# Patient Record
Sex: Female | Born: 1960 | Race: White | Hispanic: No | Marital: Married | Smoking: Never smoker
Health system: Southern US, Community
[De-identification: ages and names within clinical notes are randomized; demographics above are authoritative.]

## PROBLEM LIST (undated history)

## (undated) DIAGNOSIS — C801 Malignant (primary) neoplasm, unspecified: Secondary | ICD-10-CM

## (undated) DIAGNOSIS — R011 Cardiac murmur, unspecified: Secondary | ICD-10-CM

## (undated) HISTORY — PX: TONSILLECTOMY: SUR1361

## (undated) HISTORY — PX: TOE SOFT TISSUE RELEASE W/ PINNING: SHX2534

---

## 1999-04-07 ENCOUNTER — Other Ambulatory Visit: Admission: RE | Admit: 1999-04-07 | Discharge: 1999-04-07 | Payer: Self-pay | Admitting: Obstetrics and Gynecology

## 2001-06-05 ENCOUNTER — Other Ambulatory Visit: Admission: RE | Admit: 2001-06-05 | Discharge: 2001-06-05 | Payer: Self-pay | Admitting: Obstetrics and Gynecology

## 2002-08-21 DIAGNOSIS — C801 Malignant (primary) neoplasm, unspecified: Secondary | ICD-10-CM

## 2002-08-21 HISTORY — DX: Malignant (primary) neoplasm, unspecified: C80.1

## 2002-09-25 ENCOUNTER — Other Ambulatory Visit: Admission: RE | Admit: 2002-09-25 | Discharge: 2002-09-25 | Payer: Self-pay | Admitting: Obstetrics and Gynecology

## 2003-05-05 ENCOUNTER — Encounter (INDEPENDENT_AMBULATORY_CARE_PROVIDER_SITE_OTHER): Payer: Self-pay | Admitting: Radiology

## 2003-05-05 ENCOUNTER — Encounter: Payer: Self-pay | Admitting: Obstetrics and Gynecology

## 2003-05-05 ENCOUNTER — Encounter (INDEPENDENT_AMBULATORY_CARE_PROVIDER_SITE_OTHER): Payer: Self-pay | Admitting: *Deleted

## 2003-05-05 ENCOUNTER — Encounter: Admission: RE | Admit: 2003-05-05 | Discharge: 2003-05-05 | Payer: Self-pay | Admitting: Obstetrics and Gynecology

## 2003-05-11 ENCOUNTER — Encounter: Payer: Self-pay | Admitting: Oncology

## 2003-05-11 ENCOUNTER — Encounter (HOSPITAL_COMMUNITY): Admission: RE | Admit: 2003-05-11 | Discharge: 2003-08-09 | Payer: Self-pay | Admitting: *Deleted

## 2003-05-11 ENCOUNTER — Encounter: Admission: RE | Admit: 2003-05-11 | Discharge: 2003-05-11 | Payer: Self-pay | Admitting: Oncology

## 2003-05-12 ENCOUNTER — Encounter: Admission: RE | Admit: 2003-05-12 | Discharge: 2003-05-12 | Payer: Self-pay | Admitting: Oncology

## 2003-05-12 ENCOUNTER — Encounter: Payer: Self-pay | Admitting: Oncology

## 2003-05-13 ENCOUNTER — Encounter: Payer: Self-pay | Admitting: General Surgery

## 2003-05-13 ENCOUNTER — Ambulatory Visit (HOSPITAL_BASED_OUTPATIENT_CLINIC_OR_DEPARTMENT_OTHER): Admission: RE | Admit: 2003-05-13 | Discharge: 2003-05-13 | Payer: Self-pay | Admitting: General Surgery

## 2003-06-05 ENCOUNTER — Encounter: Admission: RE | Admit: 2003-06-05 | Discharge: 2003-06-05 | Payer: Self-pay | Admitting: *Deleted

## 2003-07-17 ENCOUNTER — Ambulatory Visit (HOSPITAL_COMMUNITY): Admission: RE | Admit: 2003-07-17 | Discharge: 2003-07-17 | Payer: Self-pay | Admitting: Family Medicine

## 2003-07-20 ENCOUNTER — Ambulatory Visit (HOSPITAL_COMMUNITY): Admission: RE | Admit: 2003-07-20 | Discharge: 2003-07-20 | Payer: Self-pay | Admitting: Oncology

## 2003-07-23 ENCOUNTER — Ambulatory Visit (HOSPITAL_COMMUNITY): Admission: RE | Admit: 2003-07-23 | Discharge: 2003-07-23 | Payer: Self-pay | Admitting: General Surgery

## 2003-07-24 ENCOUNTER — Ambulatory Visit (HOSPITAL_COMMUNITY): Admission: RE | Admit: 2003-07-24 | Discharge: 2003-07-24 | Payer: Self-pay | Admitting: Oncology

## 2003-08-03 ENCOUNTER — Ambulatory Visit (HOSPITAL_COMMUNITY): Admission: RE | Admit: 2003-08-03 | Discharge: 2003-08-03 | Payer: Self-pay | Admitting: Oncology

## 2003-08-07 ENCOUNTER — Ambulatory Visit (HOSPITAL_COMMUNITY): Admission: RE | Admit: 2003-08-07 | Discharge: 2003-08-07 | Payer: Self-pay | Admitting: Oncology

## 2003-09-20 ENCOUNTER — Emergency Department (HOSPITAL_COMMUNITY): Admission: EM | Admit: 2003-09-20 | Discharge: 2003-09-21 | Payer: Self-pay | Admitting: Emergency Medicine

## 2003-09-22 HISTORY — PX: BREAST SURGERY: SHX581

## 2003-09-26 ENCOUNTER — Ambulatory Visit: Admission: RE | Admit: 2003-09-26 | Discharge: 2003-12-28 | Payer: Self-pay | Admitting: *Deleted

## 2003-09-29 ENCOUNTER — Encounter: Admission: RE | Admit: 2003-09-29 | Discharge: 2003-09-29 | Payer: Self-pay | Admitting: Oncology

## 2003-10-06 ENCOUNTER — Encounter (INDEPENDENT_AMBULATORY_CARE_PROVIDER_SITE_OTHER): Payer: Self-pay | Admitting: *Deleted

## 2003-10-06 ENCOUNTER — Ambulatory Visit (HOSPITAL_COMMUNITY): Admission: AD | Admit: 2003-10-06 | Discharge: 2003-10-08 | Payer: Self-pay | Admitting: *Deleted

## 2004-01-29 ENCOUNTER — Ambulatory Visit: Admission: RE | Admit: 2004-01-29 | Discharge: 2004-01-29 | Payer: Self-pay | Admitting: *Deleted

## 2004-02-09 ENCOUNTER — Encounter (INDEPENDENT_AMBULATORY_CARE_PROVIDER_SITE_OTHER): Payer: Self-pay | Admitting: Cardiology

## 2004-02-09 ENCOUNTER — Ambulatory Visit: Admission: RE | Admit: 2004-02-09 | Discharge: 2004-02-09 | Payer: Self-pay | Admitting: Oncology

## 2004-05-05 ENCOUNTER — Encounter (INDEPENDENT_AMBULATORY_CARE_PROVIDER_SITE_OTHER): Payer: Self-pay | Admitting: *Deleted

## 2004-05-05 ENCOUNTER — Ambulatory Visit: Admission: RE | Admit: 2004-05-05 | Discharge: 2004-05-05 | Payer: Self-pay | Admitting: Oncology

## 2004-06-16 ENCOUNTER — Ambulatory Visit: Payer: Self-pay | Admitting: Oncology

## 2004-06-28 ENCOUNTER — Ambulatory Visit: Admission: RE | Admit: 2004-06-28 | Discharge: 2004-06-28 | Payer: Self-pay | Admitting: Oncology

## 2004-06-28 ENCOUNTER — Encounter (INDEPENDENT_AMBULATORY_CARE_PROVIDER_SITE_OTHER): Payer: Self-pay | Admitting: Cardiology

## 2004-08-23 ENCOUNTER — Ambulatory Visit: Payer: Self-pay | Admitting: Oncology

## 2004-09-06 ENCOUNTER — Encounter (INDEPENDENT_AMBULATORY_CARE_PROVIDER_SITE_OTHER): Payer: Self-pay | Admitting: Cardiology

## 2004-09-06 ENCOUNTER — Ambulatory Visit: Admission: RE | Admit: 2004-09-06 | Discharge: 2004-09-06 | Payer: Self-pay | Admitting: Oncology

## 2004-10-24 ENCOUNTER — Ambulatory Visit: Payer: Self-pay | Admitting: Oncology

## 2004-11-22 ENCOUNTER — Ambulatory Visit (HOSPITAL_COMMUNITY): Admission: RE | Admit: 2004-11-22 | Discharge: 2004-11-22 | Payer: Self-pay | Admitting: Gastroenterology

## 2004-12-20 ENCOUNTER — Ambulatory Visit: Admission: RE | Admit: 2004-12-20 | Discharge: 2004-12-20 | Payer: Self-pay | Admitting: Oncology

## 2004-12-20 ENCOUNTER — Encounter (INDEPENDENT_AMBULATORY_CARE_PROVIDER_SITE_OTHER): Payer: Self-pay | Admitting: Cardiology

## 2004-12-26 ENCOUNTER — Ambulatory Visit: Payer: Self-pay | Admitting: Oncology

## 2005-04-06 ENCOUNTER — Ambulatory Visit: Payer: Self-pay | Admitting: Oncology

## 2005-04-13 ENCOUNTER — Encounter (INDEPENDENT_AMBULATORY_CARE_PROVIDER_SITE_OTHER): Payer: Self-pay | Admitting: *Deleted

## 2005-04-13 ENCOUNTER — Ambulatory Visit: Admission: RE | Admit: 2005-04-13 | Discharge: 2005-04-13 | Payer: Self-pay | Admitting: Oncology

## 2005-10-02 ENCOUNTER — Ambulatory Visit: Payer: Self-pay | Admitting: Oncology

## 2005-10-05 ENCOUNTER — Ambulatory Visit (HOSPITAL_COMMUNITY): Admission: RE | Admit: 2005-10-05 | Discharge: 2005-10-05 | Payer: Self-pay | Admitting: Oncology

## 2006-06-08 ENCOUNTER — Ambulatory Visit: Payer: Self-pay | Admitting: Oncology

## 2006-06-12 LAB — CBC WITH DIFFERENTIAL/PLATELET
Eosinophils Absolute: 0.2 10*3/uL (ref 0.0–0.5)
LYMPH%: 30.2 % (ref 14.0–48.0)
MONO#: 0.5 10*3/uL (ref 0.1–0.9)
NEUT#: 3.5 10*3/uL (ref 1.5–6.5)
Platelets: 280 10*3/uL (ref 145–400)
RBC: 4.32 10*6/uL (ref 3.70–5.32)
WBC: 6 10*3/uL (ref 3.9–10.0)
lymph#: 1.8 10*3/uL (ref 0.9–3.3)

## 2006-06-12 LAB — COMPREHENSIVE METABOLIC PANEL
Albumin: 4.7 g/dL (ref 3.5–5.2)
Alkaline Phosphatase: 67 U/L (ref 39–117)
BUN: 11 mg/dL (ref 6–23)
Creatinine, Ser: 0.76 mg/dL (ref 0.40–1.20)
Glucose, Bld: 86 mg/dL (ref 70–99)
Potassium: 4.8 mEq/L (ref 3.5–5.3)

## 2006-12-06 ENCOUNTER — Ambulatory Visit: Payer: Self-pay | Admitting: Oncology

## 2006-12-11 LAB — CBC WITH DIFFERENTIAL/PLATELET
Eosinophils Absolute: 0.1 10*3/uL (ref 0.0–0.5)
HGB: 13.1 g/dL (ref 11.6–15.9)
LYMPH%: 24 % (ref 14.0–48.0)
MONO#: 0.4 10*3/uL (ref 0.1–0.9)
NEUT#: 4 10*3/uL (ref 1.5–6.5)
Platelets: 239 10*3/uL (ref 145–400)
RBC: 4.36 10*6/uL (ref 3.70–5.32)
RDW: 13.5 % (ref 11.3–14.5)
WBC: 6 10*3/uL (ref 3.9–10.0)

## 2006-12-11 LAB — COMPREHENSIVE METABOLIC PANEL
Albumin: 4.7 g/dL (ref 3.5–5.2)
CO2: 26 mEq/L (ref 19–32)
Calcium: 9.5 mg/dL (ref 8.4–10.5)
Glucose, Bld: 139 mg/dL — ABNORMAL HIGH (ref 70–99)
Potassium: 4.5 mEq/L (ref 3.5–5.3)
Sodium: 140 mEq/L (ref 135–145)
Total Protein: 7.3 g/dL (ref 6.0–8.3)

## 2006-12-11 LAB — CANCER ANTIGEN 27.29: CA 27.29: 13 U/mL (ref 0–39)

## 2007-06-06 ENCOUNTER — Ambulatory Visit: Payer: Self-pay | Admitting: Oncology

## 2007-06-11 LAB — CBC WITH DIFFERENTIAL/PLATELET
BASO%: 0.6 % (ref 0.0–2.0)
EOS%: 2.3 % (ref 0.0–7.0)
LYMPH%: 21.4 % (ref 14.0–48.0)
MCH: 29.6 pg (ref 26.0–34.0)
MCHC: 33.7 g/dL (ref 32.0–36.0)
MCV: 87.7 fL (ref 81.0–101.0)
MONO%: 6.7 % (ref 0.0–13.0)
NEUT%: 69 % (ref 39.6–76.8)
Platelets: 267 10*3/uL (ref 145–400)
RBC: 4.37 10*6/uL (ref 3.70–5.32)
WBC: 6.3 10*3/uL (ref 3.9–10.0)

## 2007-06-11 LAB — COMPREHENSIVE METABOLIC PANEL
ALT: 19 U/L (ref 0–35)
Alkaline Phosphatase: 72 U/L (ref 39–117)
Creatinine, Ser: 0.8 mg/dL (ref 0.40–1.20)
Sodium: 142 mEq/L (ref 135–145)
Total Bilirubin: 0.4 mg/dL (ref 0.3–1.2)
Total Protein: 7.5 g/dL (ref 6.0–8.3)

## 2007-12-05 ENCOUNTER — Ambulatory Visit: Payer: Self-pay | Admitting: Oncology

## 2007-12-12 LAB — CBC WITH DIFFERENTIAL/PLATELET
Basophils Absolute: 0 10*3/uL (ref 0.0–0.1)
EOS%: 2.1 % (ref 0.0–7.0)
HCT: 38.3 % (ref 34.8–46.6)
HGB: 13.6 g/dL (ref 11.6–15.9)
MCH: 30.2 pg (ref 26.0–34.0)
MCV: 85.1 fL (ref 81.0–101.0)
MONO%: 8.1 % (ref 0.0–13.0)
NEUT%: 62.4 % (ref 39.6–76.8)

## 2007-12-13 LAB — COMPREHENSIVE METABOLIC PANEL
AST: 22 U/L (ref 0–37)
Alkaline Phosphatase: 69 U/L (ref 39–117)
BUN: 13 mg/dL (ref 6–23)
Calcium: 9.6 mg/dL (ref 8.4–10.5)
Chloride: 104 mEq/L (ref 96–112)
Creatinine, Ser: 0.71 mg/dL (ref 0.40–1.20)

## 2008-06-08 ENCOUNTER — Ambulatory Visit: Payer: Self-pay | Admitting: Oncology

## 2008-06-10 LAB — CBC WITH DIFFERENTIAL/PLATELET
BASO%: 1.1 % (ref 0.0–2.0)
LYMPH%: 24.1 % (ref 14.0–48.0)
MCHC: 34.9 g/dL (ref 32.0–36.0)
MONO#: 0.4 10*3/uL (ref 0.1–0.9)
MONO%: 7.2 % (ref 0.0–13.0)
Platelets: 225 10*3/uL (ref 145–400)
RBC: 4.35 10*6/uL (ref 3.70–5.32)
RDW: 12.6 % (ref 11.3–14.5)
WBC: 5.2 10*3/uL (ref 3.9–10.0)

## 2008-06-11 LAB — COMPREHENSIVE METABOLIC PANEL
ALT: 21 U/L (ref 0–35)
Alkaline Phosphatase: 66 U/L (ref 39–117)
CO2: 24 mEq/L (ref 19–32)
Sodium: 140 mEq/L (ref 135–145)
Total Bilirubin: 0.5 mg/dL (ref 0.3–1.2)
Total Protein: 7 g/dL (ref 6.0–8.3)

## 2008-12-07 ENCOUNTER — Ambulatory Visit: Payer: Self-pay | Admitting: Oncology

## 2008-12-09 LAB — CBC WITH DIFFERENTIAL/PLATELET
Basophils Absolute: 0 10*3/uL (ref 0.0–0.1)
EOS%: 2.3 % (ref 0.0–7.0)
Eosinophils Absolute: 0.2 10*3/uL (ref 0.0–0.5)
HCT: 39.1 % (ref 34.8–46.6)
HGB: 13.4 g/dL (ref 11.6–15.9)
MONO#: 0.6 10*3/uL (ref 0.1–0.9)
NEUT#: 3.8 10*3/uL (ref 1.5–6.5)
RDW: 13.4 % (ref 11.2–14.5)
WBC: 6.8 10*3/uL (ref 3.9–10.3)
lymph#: 2.3 10*3/uL (ref 0.9–3.3)

## 2008-12-09 LAB — COMPREHENSIVE METABOLIC PANEL
AST: 22 U/L (ref 0–37)
Albumin: 4.4 g/dL (ref 3.5–5.2)
BUN: 13 mg/dL (ref 6–23)
CO2: 27 mEq/L (ref 19–32)
Calcium: 9.5 mg/dL (ref 8.4–10.5)
Chloride: 101 mEq/L (ref 96–112)
Glucose, Bld: 81 mg/dL (ref 70–99)
Potassium: 3.9 mEq/L (ref 3.5–5.3)

## 2008-12-10 LAB — CANCER ANTIGEN 27.29: CA 27.29: 19 U/mL (ref 0–39)

## 2009-05-17 ENCOUNTER — Emergency Department (HOSPITAL_COMMUNITY): Admission: EM | Admit: 2009-05-17 | Discharge: 2009-05-18 | Payer: Self-pay | Admitting: Emergency Medicine

## 2009-06-04 ENCOUNTER — Ambulatory Visit: Payer: Self-pay | Admitting: Oncology

## 2009-06-08 LAB — CBC WITH DIFFERENTIAL/PLATELET
BASO%: 0.3 % (ref 0.0–2.0)
Eosinophils Absolute: 0.2 10*3/uL (ref 0.0–0.5)
HCT: 37.4 % (ref 34.8–46.6)
LYMPH%: 28.1 % (ref 14.0–49.7)
MCHC: 35.5 g/dL (ref 31.5–36.0)
MCV: 88.9 fL (ref 79.5–101.0)
MONO#: 0.4 10*3/uL (ref 0.1–0.9)
MONO%: 6.4 % (ref 0.0–14.0)
NEUT%: 62.4 % (ref 38.4–76.8)
Platelets: 198 10*3/uL (ref 145–400)
RBC: 4.2 10*6/uL (ref 3.70–5.45)
WBC: 5.8 10*3/uL (ref 3.9–10.3)

## 2009-06-08 LAB — COMPREHENSIVE METABOLIC PANEL
Alkaline Phosphatase: 46 U/L (ref 39–117)
CO2: 28 mEq/L (ref 19–32)
Creatinine, Ser: 0.67 mg/dL (ref 0.40–1.20)
Glucose, Bld: 104 mg/dL — ABNORMAL HIGH (ref 70–99)
Total Bilirubin: 0.7 mg/dL (ref 0.3–1.2)

## 2009-06-09 LAB — CANCER ANTIGEN 27.29: CA 27.29: 18 U/mL (ref 0–39)

## 2009-06-09 LAB — VITAMIN D 25 HYDROXY (VIT D DEFICIENCY, FRACTURES): Vit D, 25-Hydroxy: 25 ng/mL — ABNORMAL LOW (ref 30–89)

## 2010-01-19 DIAGNOSIS — H538 Other visual disturbances: Secondary | ICD-10-CM

## 2010-01-19 DIAGNOSIS — R002 Palpitations: Secondary | ICD-10-CM

## 2010-01-20 ENCOUNTER — Ambulatory Visit: Payer: Self-pay | Admitting: Cardiovascular Disease

## 2010-01-20 DIAGNOSIS — R9431 Abnormal electrocardiogram [ECG] [EKG]: Secondary | ICD-10-CM

## 2010-06-03 ENCOUNTER — Ambulatory Visit: Payer: Self-pay | Admitting: Oncology

## 2010-06-07 LAB — CBC WITH DIFFERENTIAL/PLATELET
BASO%: 0.2 % (ref 0.0–2.0)
Basophils Absolute: 0 10*3/uL (ref 0.0–0.1)
EOS%: 2 % (ref 0.0–7.0)
Eosinophils Absolute: 0.1 10*3/uL (ref 0.0–0.5)
HCT: 36.3 % (ref 34.8–46.6)
HGB: 12.6 g/dL (ref 11.6–15.9)
LYMPH%: 24.8 % (ref 14.0–49.7)
MCH: 31.5 pg (ref 25.1–34.0)
MCHC: 34.9 g/dL (ref 31.5–36.0)
MCV: 90.3 fL (ref 79.5–101.0)
MONO#: 0.4 10*3/uL (ref 0.1–0.9)
MONO%: 7.7 % (ref 0.0–14.0)
NEUT#: 3.8 10*3/uL (ref 1.5–6.5)
NEUT%: 65.3 % (ref 38.4–76.8)
Platelets: 205 10*3/uL (ref 145–400)
RBC: 4.01 10*6/uL (ref 3.70–5.45)
RDW: 12.6 % (ref 11.2–14.5)
WBC: 5.8 10*3/uL (ref 3.9–10.3)
lymph#: 1.4 10*3/uL (ref 0.9–3.3)

## 2010-06-07 LAB — COMPREHENSIVE METABOLIC PANEL
ALT: 23 U/L (ref 0–35)
AST: 26 U/L (ref 0–37)
Albumin: 4 g/dL (ref 3.5–5.2)
Alkaline Phosphatase: 52 U/L (ref 39–117)
BUN: 14 mg/dL (ref 6–23)
CO2: 30 mEq/L (ref 19–32)
Calcium: 9.2 mg/dL (ref 8.4–10.5)
Chloride: 107 mEq/L (ref 96–112)
Creatinine, Ser: 0.79 mg/dL (ref 0.40–1.20)
Glucose, Bld: 110 mg/dL — ABNORMAL HIGH (ref 70–99)
Potassium: 3.8 mEq/L (ref 3.5–5.3)
Sodium: 141 mEq/L (ref 135–145)
Total Bilirubin: 0.6 mg/dL (ref 0.3–1.2)
Total Protein: 6.9 g/dL (ref 6.0–8.3)

## 2010-06-08 LAB — VITAMIN D 25 HYDROXY (VIT D DEFICIENCY, FRACTURES): Vit D, 25-Hydroxy: 47 ng/mL (ref 30–89)

## 2010-06-08 LAB — CANCER ANTIGEN 27.29: CA 27.29: 10 U/mL (ref 0–39)

## 2010-09-10 ENCOUNTER — Encounter: Payer: Self-pay | Admitting: Oncology

## 2010-09-20 NOTE — Assessment & Plan Note (Signed)
Summary: np6/palps/jml   Visit Type:  Follow-up Primary Provider:  Rochell Muse, PA-C  CC:  No cardiac complaints.  History of Present Illness: 50 yo WF with history of breast cancer but no history of DM, HTN or hyperlipidemia who is referred today for evaluation of "skipped heartbeat". She was at work several weeks ago and had floaters over her eyes, eyes were watering. She did not feel dizzy. No chest pain or SOB. The symptoms lasted for several minutes. She went to primary care for evaluation because a coworker felt her pulse and thought it was irregular. She was not aware of any irregularity of her heart rhythm. She has never noticed palpitations. She feels in her normal state of health. She is due to see an ophthalmologist tomorrow.   Current Medications (verified): 1)  Venlafaxine Hcl 37.5 Mg Tabs (Venlafaxine Hcl) .... Take One Tablet By Mouth Once Daily. 2)  Gabapentin 300 Mg Caps (Gabapentin) .Marland Kitchen.. 1 Capsule Daily 3)  Alendronate Sodium 70 Mg Tabs (Alendronate Sodium) .... Weekly 4)  Tamoxifen Citrate 20 Mg Tabs (Tamoxifen Citrate) .... Take One Tablet By Mouth Once Daily. 5)  Vitamin D .... Once Daily 6)  Calcium Carbonate   Powd (Calcium Carbonate) .... Once Daily  Allergies (verified): No Known Drug Allergies  Past History:  Past Medical History: Breast cancer 6 years ago- bilateral mastectomy, chemo, radiation  Past Surgical History: Bilateral mastectomy Tonsillectomy as child SH/Risk Factors reviewed for relevance  Family History: Mother and father alive.  Father alive has stage IV lung cancer Mother alive and healthy No siblings Paternal grandfather CAD  Social History: No tobacco Social alcohol No illicit drugs Married, 2 children (20,22) Curriculum specialist with [employer]  Review of Systems  The patient denies fatigue, malaise, fever, weight gain/loss, vision loss, decreased hearing, hoarseness, chest pain, palpitations, shortness of  breath, prolonged cough, wheezing, sleep apnea, coughing up blood, abdominal pain, blood in stool, nausea, vomiting, diarrhea, heartburn, incontinence, blood in urine, muscle weakness, joint pain, leg swelling, rash, skin lesions, headache, fainting, dizziness, depression, anxiety, enlarged lymph nodes, easy bruising or bleeding, and environmental allergies.         Visual changes, see HPI  Vital Signs:  Patient profile:   50 year old female Height:      62 inches Weight:      182 pounds BMI:     33.41 Pulse rate:   100 / minute BP sitting:   140 / 84  (left arm)  Vitals Entered By: Laurance Flatten CMA (January 20, 2010 10:58 AM)  Physical Exam  General:  General: Well developed, well nourished, NAD HEENT: OP clear, mucus membranes moist SKIN: warm, dry Neuro: No focal deficits Musculoskeletal: Muscle strength 5/5 all ext Psychiatric: Mood and affect normal Neck: No JVD, no carotid bruits, no thyromegaly, no lymphadenopathy. Lungs:Clear bilaterally, no wheezes, rhonci, crackles CV: RRR no murmurs, gallops rubs Abdomen: soft, NT, ND, BS present Extremities: No edema, pulses 2+.    EKG  Procedure date:  01/20/2010  Findings:      NSR, rate 100 bpm. LAD.   Impression & Recommendations:  Problem # 1:  ABNORMAL EKG (ICD-794.31) She has left axis deviation on EKG with NSR. She has had no palpitations, irregularity of her heart rhythm, dizziness, near syncope or syncope. I do not see need for further cardiac workup at this time. She will alert [place] of any changes in her clinical status.   Other Orders: EKG w/ Interpretation ([id number])  Patient Instructions:  1)  Your physician recommends that you schedule a follow-up appointment in: as needed.

## 2010-11-25 LAB — URINALYSIS, ROUTINE W REFLEX MICROSCOPIC
Bilirubin Urine: NEGATIVE
Nitrite: NEGATIVE
Specific Gravity, Urine: 1.009 (ref 1.005–1.030)
Urobilinogen, UA: 0.2 mg/dL (ref 0.0–1.0)
pH: 7.5 (ref 5.0–8.0)

## 2010-11-25 LAB — CBC
HCT: 42.5 % (ref 36.0–46.0)
Hemoglobin: 14.4 g/dL (ref 12.0–15.0)
MCHC: 33.8 g/dL (ref 30.0–36.0)
MCV: 90.6 fL (ref 78.0–100.0)
Platelets: 224 10*3/uL (ref 150–400)
RBC: 4.69 MIL/uL (ref 3.87–5.11)
RDW: 13.8 % (ref 11.5–15.5)
WBC: 15.8 10*3/uL — ABNORMAL HIGH (ref 4.0–10.5)

## 2010-11-25 LAB — URINE MICROSCOPIC-ADD ON

## 2010-11-25 LAB — COMPREHENSIVE METABOLIC PANEL
ALT: 17 U/L (ref 0–35)
AST: 22 U/L (ref 0–37)
Albumin: 4.2 g/dL (ref 3.5–5.2)
Alkaline Phosphatase: 58 U/L (ref 39–117)
BUN: 10 mg/dL (ref 6–23)
CO2: 28 mEq/L (ref 19–32)
Calcium: 9.1 mg/dL (ref 8.4–10.5)
Chloride: 102 mEq/L (ref 96–112)
Creatinine, Ser: 0.74 mg/dL (ref 0.4–1.2)
GFR calc Af Amer: >60 mL/min (ref >60)
GFR calc non Af Amer: >60 mL/min (ref >60)
Glucose, Bld: 99 mg/dL (ref 70–99)
Potassium: 3.3 mEq/L — ABNORMAL LOW (ref 3.5–5.1)
Sodium: 138 mEq/L (ref 135–145)
Total Bilirubin: 1 mg/dL (ref 0.3–1.2)
Total Protein: 7.6 g/dL (ref 6.0–8.3)

## 2010-11-25 LAB — DIFFERENTIAL
Basophils Absolute: 0 10*3/uL (ref 0.0–0.1)
Basophils Relative: 0 % (ref 0–1)
Eosinophils Absolute: 0.1 10*3/uL (ref 0.0–0.7)
Eosinophils Relative: 1 % (ref 0–5)
Lymphocytes Relative: 13 % (ref 12–46)
Lymphs Abs: 2 10*3/uL (ref 0.7–4.0)
Monocytes Absolute: 0.9 10*3/uL (ref 0.1–1.0)
Monocytes Relative: 6 % (ref 3–12)
Neutro Abs: 12.7 10*3/uL — ABNORMAL HIGH (ref 1.7–7.7)
Neutrophils Relative %: 81 % — ABNORMAL HIGH (ref 43–77)

## 2010-11-25 LAB — LIPASE, BLOOD: Lipase: 40 U/L (ref 11–59)

## 2011-01-06 NOTE — Op Note (Signed)
Tammy Huynh, BREVIK NO.:  [id number]   MEDICAL RECORD NO.:  [id number]          PATIENT TYPE:  AMB   LOCATION:  ENDO                         FACILITY:  [facility]   PHYSICIAN:  Graylin Shiver, M.D.   DATE OF BIRTH:  [birth year]   DATE OF PROCEDURE:  11/22/2004  DATE OF DISCHARGE:                                 OPERATIVE REPORT   PROCEDURE:  Colonoscopy.   INDICATIONS:  Rectal bleeding.   Informed consent was obtained after explanation of the risks of bleeding,  infection and perforation.   PREMEDICATION:  Fentanyl 80 mcg IV, Versed 9 mg IV.   PROCEDURE:  With the patient in the left lateral decubitus position, a  rectal exam was performed.  No masses were felt in the rectal vault.  There  were protruding hemorrhoids.  The Olympus colonoscope was inserted into the  rectum and advanced around the colon to the cecum.  Cecal landmarks were  identified.  The cecum and descending colon were normal, the transverse  colon normal.  The descending colon was normal.  The sigmoid showed a few diverticula.  The rectum was normal.  She tolerated  the procedure well without complications.   IMPRESSION:  1.  Hemorrhoids.  2.  Few diverticula in the sigmoid.   I would recommend a follow-up screening colonoscopy again in 10 years.      SFG/MEDQ  D:  11/22/2004  T:  11/22/2004  Job:  [id number]   cc:   Valentino Hue. Magrinat, M.D.  [address] [facility]  [place]  [place] [address]  Fax: [phone]

## 2011-06-12 ENCOUNTER — Encounter (HOSPITAL_BASED_OUTPATIENT_CLINIC_OR_DEPARTMENT_OTHER): Payer: BC Managed Care – PPO | Admitting: Oncology

## 2011-06-12 ENCOUNTER — Other Ambulatory Visit: Payer: Self-pay | Admitting: Oncology

## 2011-06-12 DIAGNOSIS — C50419 Malignant neoplasm of upper-outer quadrant of unspecified female breast: Secondary | ICD-10-CM

## 2011-06-12 DIAGNOSIS — Z170 Estrogen receptor positive status [ER+]: Secondary | ICD-10-CM

## 2011-06-12 LAB — CBC WITH DIFFERENTIAL/PLATELET
Basophils Absolute: 0 10*3/uL (ref 0.0–0.1)
Eosinophils Absolute: 0.1 10*3/uL (ref 0.0–0.5)
HGB: 13.4 g/dL (ref 11.6–15.9)
LYMPH%: 30 % (ref 14.0–49.7)
MCV: 91.4 fL (ref 79.5–101.0)
MONO#: 0.4 10*3/uL (ref 0.1–0.9)
MONO%: 5.4 % (ref 0.0–14.0)
NEUT#: 4.1 10*3/uL (ref 1.5–6.5)
Platelets: 216 10*3/uL (ref 145–400)
RBC: 4.26 10*6/uL (ref 3.70–5.45)
RDW: 13.3 % (ref 11.2–14.5)
WBC: 6.6 10*3/uL (ref 3.9–10.3)

## 2011-06-13 LAB — COMPREHENSIVE METABOLIC PANEL
ALT: 27 U/L (ref 0–35)
Alkaline Phosphatase: 46 U/L (ref 39–117)
CO2: 27 mEq/L (ref 19–32)
Creatinine, Ser: 0.77 mg/dL (ref 0.50–1.10)
Sodium: 139 mEq/L (ref 135–145)
Total Bilirubin: 0.4 mg/dL (ref 0.3–1.2)
Total Protein: 7 g/dL (ref 6.0–8.3)

## 2011-06-13 LAB — CANCER ANTIGEN 27.29: CA 27.29: 8 U/mL (ref 0–39)

## 2011-06-15 ENCOUNTER — Encounter (HOSPITAL_BASED_OUTPATIENT_CLINIC_OR_DEPARTMENT_OTHER): Payer: BC Managed Care – PPO | Admitting: Oncology

## 2011-06-15 DIAGNOSIS — C50919 Malignant neoplasm of unspecified site of unspecified female breast: Secondary | ICD-10-CM

## 2011-06-15 DIAGNOSIS — Z79810 Long term (current) use of selective estrogen receptor modulators (SERMs): Secondary | ICD-10-CM

## 2011-06-15 DIAGNOSIS — Z9010 Acquired absence of unspecified breast and nipple: Secondary | ICD-10-CM

## 2011-07-14 ENCOUNTER — Other Ambulatory Visit: Payer: Self-pay | Admitting: *Deleted

## 2011-07-14 DIAGNOSIS — C50419 Malignant neoplasm of upper-outer quadrant of unspecified female breast: Secondary | ICD-10-CM

## 2011-07-14 MED ORDER — VENLAFAXINE HCL 75 MG PO TABS: 75.0000 mg | ORAL_TABLET | Freq: Every day | ORAL | Status: AC

## 2011-07-24 IMAGING — CT CT ABDOMEN W/ CM
2 of 5 series · 17 of 46 positions shown, 19 images · IV contrast (agent unspecified)
Comparison: None available

CT ABDOMEN

CLINICAL DATA: Abdominal pain.  Right lower quadrant pain for 1
day.  White cell count 15.8.  RBCs white blood cells in urine.
Negative urine pregnancy test.  History breast cancer 6 years ago.

CT ABDOMEN AND PELVIS WITH CONTRAST
TECHNIQUE: Multidetector CT imaging of the abdomen and pelvis was
performed using the standard protocol following bolus
administration of intravenous contrast.
Contrast: 100 ml [id number]

[Series 2: rtn ap with st · axial · 0.66mm/px · z∈[+826,+1226]mm · 14 of 90 slices shown, 16 images]
[im 5/90  soft-tissue]
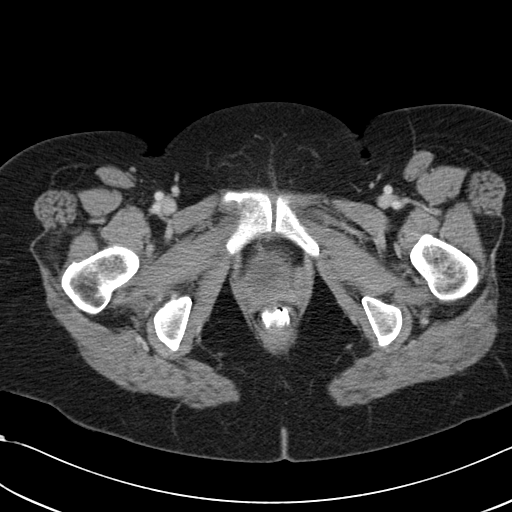
[im 5/90  bone]
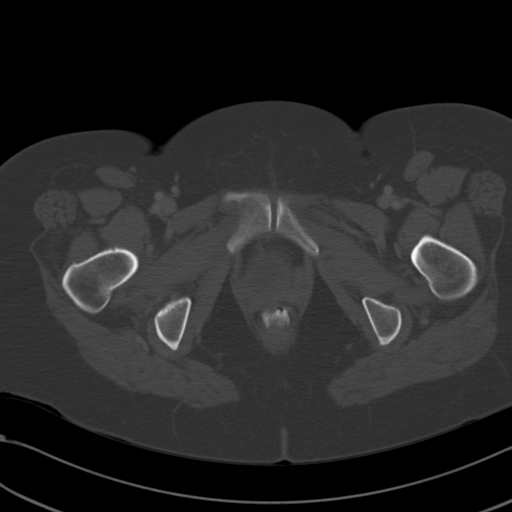
[im 14/90  soft-tissue]
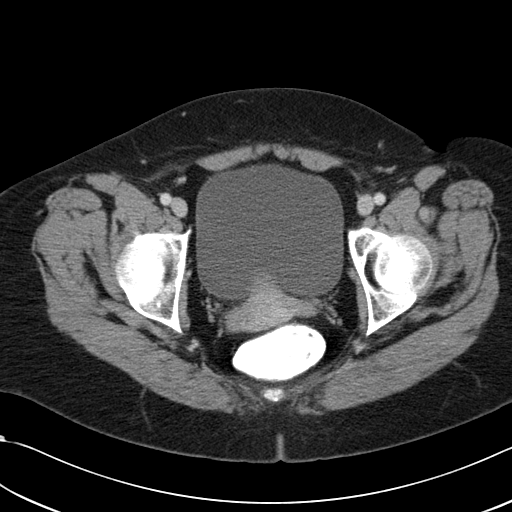
[im 18/90  soft-tissue]
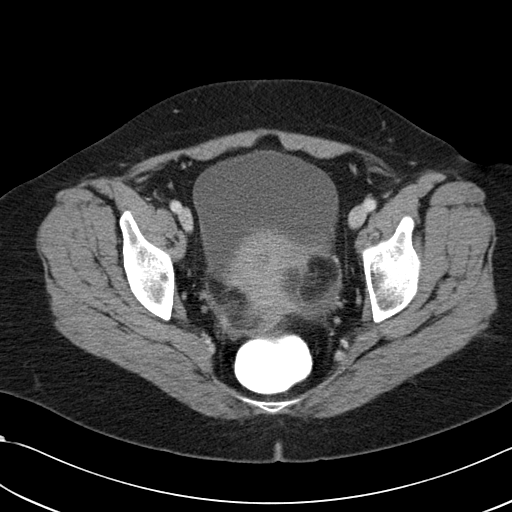
[im 23/90  soft-tissue]
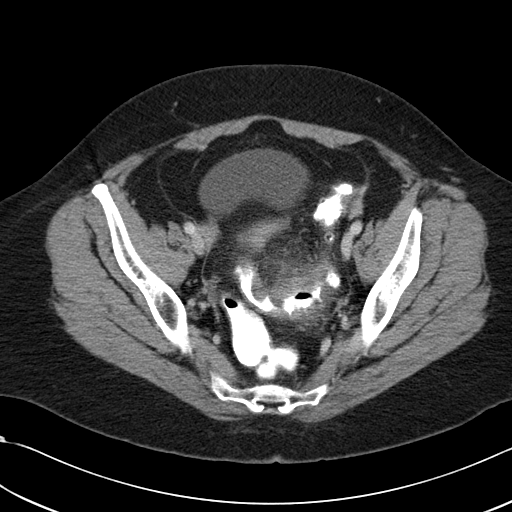
[im 32/90  soft-tissue]
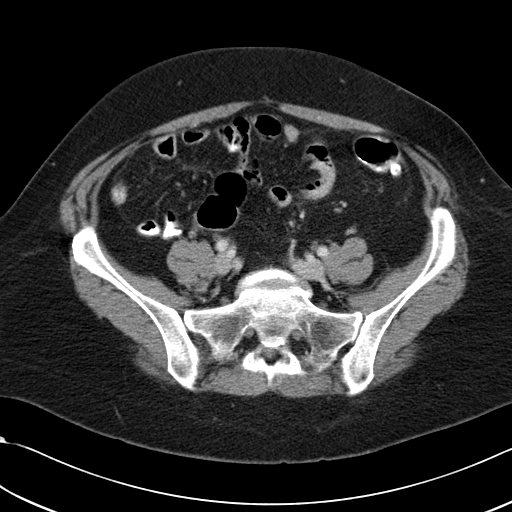
[im 36/90  soft-tissue]
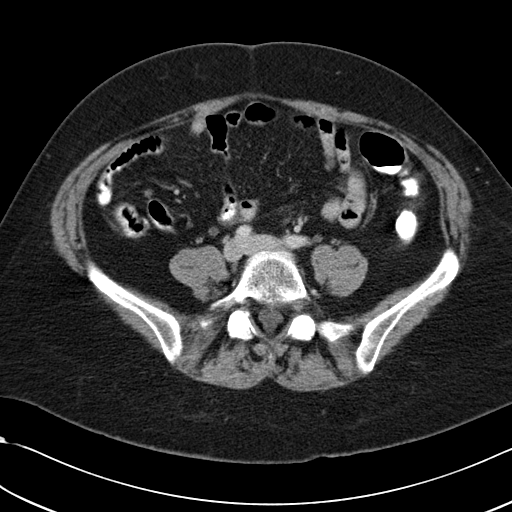
[im 41/90  soft-tissue]
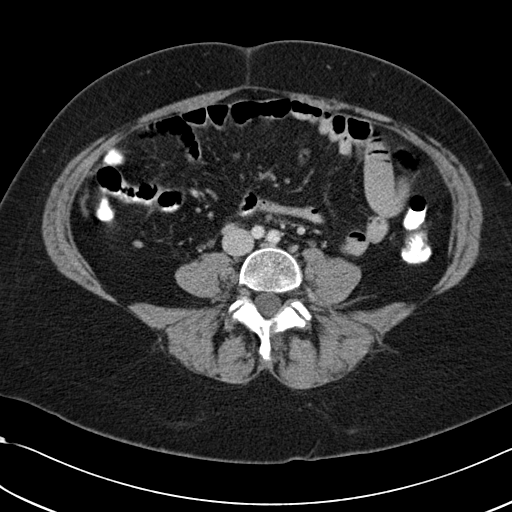
[im 49/90  soft-tissue]
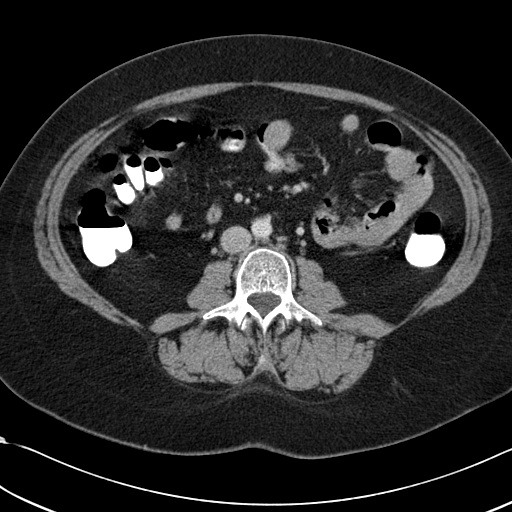
[im 54/90  soft-tissue]
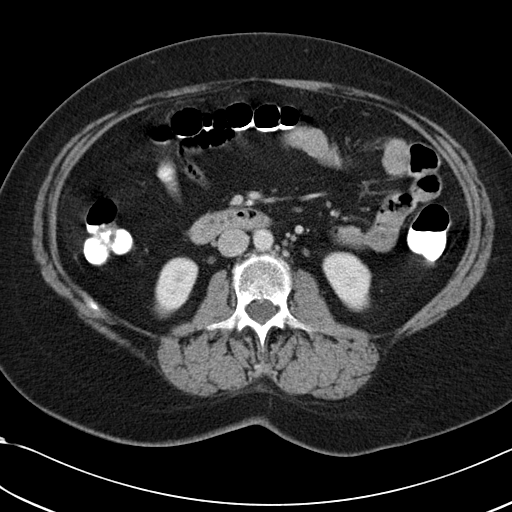
[im 54/90  bone]
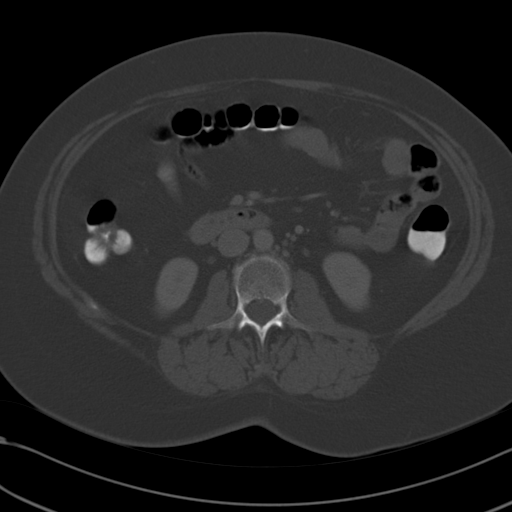
[im 58/90  soft-tissue]
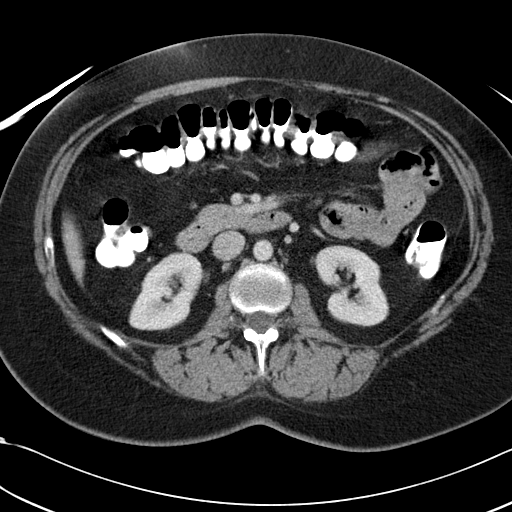
[im 67/90  soft-tissue]
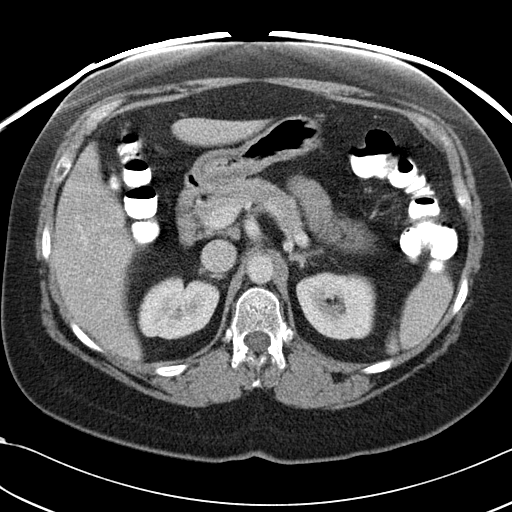
[im 72/90  soft-tissue]
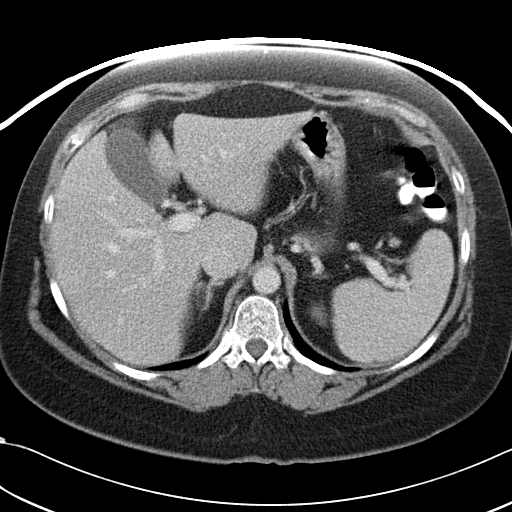
[im 76/90  soft-tissue]
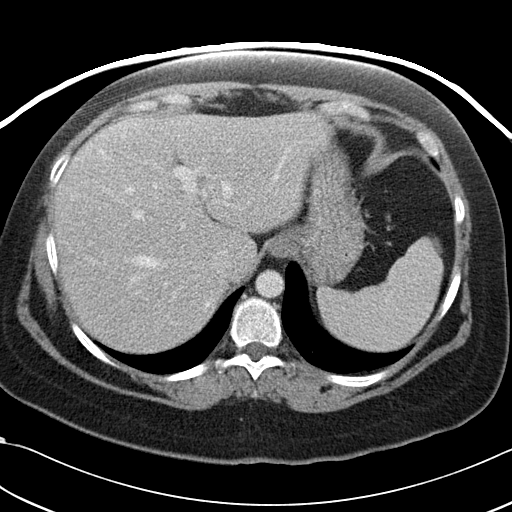
[im 85/90  soft-tissue]
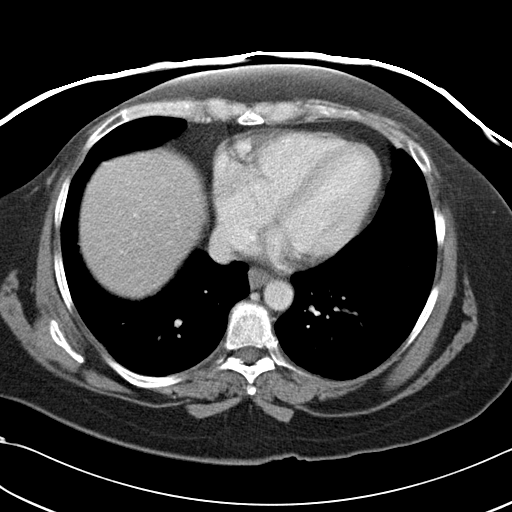

[Series 602: <mpr thick range> · coronal · 0.91mm/px · 3 of 77 slices shown]
[im 26/77  soft-tissue]
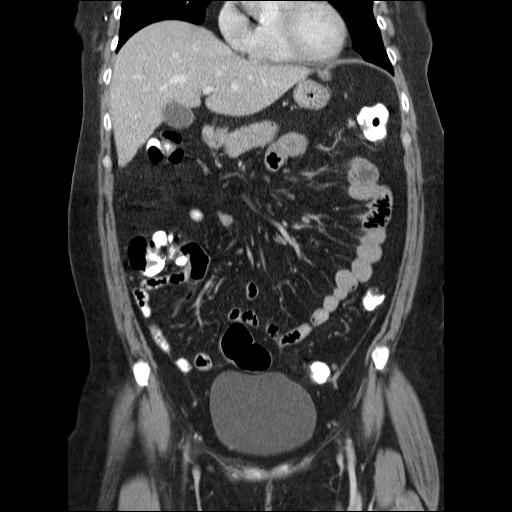
[im 34/77  soft-tissue]
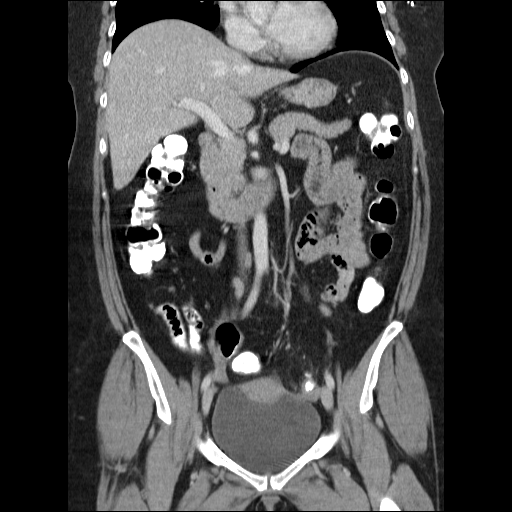
[im 43/77  soft-tissue]
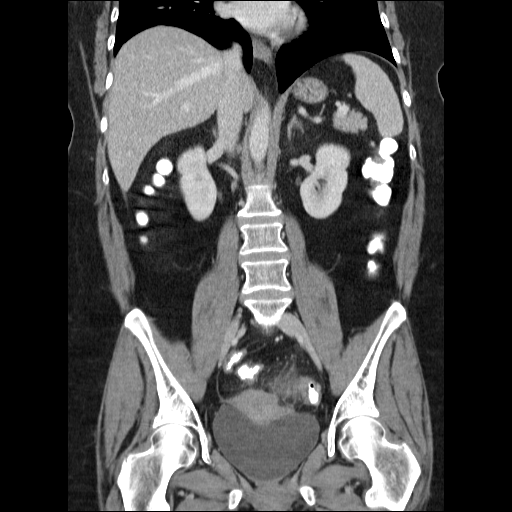

[17 of 46 positions shown; findings below may reference images not displayed]

FINDINGS: Images of the lung bases are unremarkable.  No focal
abnormalities identified within the liver, spleen, pancreas,
adrenal glands, or kidneys.  The gallbladder is present.  There is
no retroperitoneal adenopathy.  The appendix is not well seen.
IMPRESSION: No evidence for acute abdominal abnormality.

CT PELVIS
FINDINGS: Sigmoid colon is markedly thickened and irregular.
Sigmoid diverticula are identified.  The findings are most
consistent with acute diverticulitis.  However, given the marked
wall thickening (2 cm), follow-up barium enema or colonoscopy would
be recommended to exclude malignancy. There is no evidence for
abscess or free intraperitoneal air.

The uterus is present.  No adnexal mass identified.  No pelvic
adenopathy.
IMPRESSION: 1.  Marked irregular thickening of the sigmoid colon, most
consistent with sigmoid diverticulitis.  However, following
clinical improvement, barium enema or colonoscopy would be
suggested to exclude malignancy within this segment of the colon.
2.  No evidence for abscess or free intraperitoneal air.

## 2011-08-24 ENCOUNTER — Other Ambulatory Visit: Payer: Self-pay | Admitting: *Deleted

## 2011-08-24 DIAGNOSIS — C50419 Malignant neoplasm of upper-outer quadrant of unspecified female breast: Secondary | ICD-10-CM

## 2011-08-24 MED ORDER — ALENDRONATE SODIUM 70 MG PO TABS: 70.0000 mg | ORAL_TABLET | ORAL | Status: AC

## 2011-11-17 ENCOUNTER — Other Ambulatory Visit: Payer: Self-pay

## 2011-11-17 DIAGNOSIS — C50919 Malignant neoplasm of unspecified site of unspecified female breast: Secondary | ICD-10-CM

## 2011-11-17 MED ORDER — TAMOXIFEN CITRATE 20 MG PO TABS: 20.0000 mg | ORAL_TABLET | Freq: Every day | ORAL | Status: AC

## 2012-06-13 ENCOUNTER — Other Ambulatory Visit (HOSPITAL_BASED_OUTPATIENT_CLINIC_OR_DEPARTMENT_OTHER): Payer: BC Managed Care – PPO | Admitting: Lab

## 2012-06-13 ENCOUNTER — Telehealth: Payer: Self-pay | Admitting: Oncology

## 2012-06-13 ENCOUNTER — Ambulatory Visit (HOSPITAL_BASED_OUTPATIENT_CLINIC_OR_DEPARTMENT_OTHER): Payer: BC Managed Care – PPO | Admitting: Oncology

## 2012-06-13 ENCOUNTER — Ambulatory Visit: Payer: BC Managed Care – PPO | Admitting: Lab

## 2012-06-13 VITALS — BP 131/83 | HR 101 | Temp 98.7°F | Resp 20 | Ht 62.0 in | Wt 184.0 lb

## 2012-06-13 DIAGNOSIS — C50919 Malignant neoplasm of unspecified site of unspecified female breast: Secondary | ICD-10-CM

## 2012-06-13 DIAGNOSIS — C50419 Malignant neoplasm of upper-outer quadrant of unspecified female breast: Secondary | ICD-10-CM

## 2012-06-13 DIAGNOSIS — Z170 Estrogen receptor positive status [ER+]: Secondary | ICD-10-CM

## 2012-06-13 LAB — CBC WITH DIFFERENTIAL/PLATELET
BASO%: 0.5 % (ref 0.0–2.0)
HCT: 41.1 % (ref 34.8–46.6)
HGB: 14.2 g/dL (ref 11.6–15.9)
MCHC: 34.4 g/dL (ref 31.5–36.0)
MONO#: 0.5 10*3/uL (ref 0.1–0.9)
NEUT%: 60 % (ref 38.4–76.8)
WBC: 7 10*3/uL (ref 3.9–10.3)
lymph#: 2.1 10*3/uL (ref 0.9–3.3)

## 2012-06-13 LAB — COMPREHENSIVE METABOLIC PANEL (CC13)
ALT: 20 U/L (ref 0–55)
Albumin: 4.2 g/dL (ref 3.5–5.0)
CO2: 24 mEq/L (ref 22–29)
Calcium: 9.2 mg/dL (ref 8.4–10.4)
Chloride: 107 mEq/L (ref 98–107)
Creatinine: 0.6 mg/dL (ref 0.6–1.1)
Total Protein: 7.1 g/dL (ref 6.4–8.3)

## 2012-06-13 MED ORDER — GABAPENTIN 300 MG PO CAPS: 300.0000 mg | ORAL_CAPSULE | Freq: Every day | ORAL | Status: DC

## 2012-06-13 MED ORDER — TAMOXIFEN CITRATE 20 MG PO TABS: 20.0000 mg | ORAL_TABLET | Freq: Every day | ORAL | Status: DC

## 2012-06-13 NOTE — Progress Notes (Signed)
ID: [name]   DOB: 07-02-1961  MR#: [id number]  NWG#:[id number]  PCP: No primary provider on file. GYN: Richarda Overlie MD SU:  OTHER MD:   HISTORY OF PRESENT ILLNESS: Mrs. Laiche was taking a shower on 9/13 when she noted a lump in her right breast. She brought this to Dr. Dennie Bible attention the next day, and the day after that, he set her up for ultrasound and mammogram of the right breast at [facility].  This showed a new mass (not appreciated last February when she had her most recent mammogram) in the upper outer aspect of the right breast, measuring approximately 3.5 cm by ultrasound, with a second smaller mass measuring approximately 7 mm.  The axilla was clinically negative.  Core biopsy was performed and the results ([id number]) show a high-grade infiltrating ductal carcinoma. Her subsequent history is as detailed below.   INTERVAL HISTORY: Rasheda returns today for followup of her breast cancer. Interval history is unremarkable. She is really enjoying her work at [employer], which she does not find stressful.  REVIEW OF SYSTEMS: She is tolerating the tamoxifen without significant side effects. She still does have nighttime hot flashes, which wake her up 2-3 times a night. A detailed review of systems today was otherwise entirely negative.  PAST MEDICAL HISTORY: No past medical history on file. Significant for fibromyalgia which has been inactive, a heart murmur which was noted 8/16 and which is not apparent now, and has never required antibiotic prophylaxis, and a history of a mild anxiety disorder, status post tonsillectomy and adenoidectomy.  PAST SURGICAL HISTORY: No past surgical history on file.  FAMILY HISTORY (updated OCT 2013) No family history on file. The patient's father has a history of diabetes. The patient's mother is in good health.  The patient's maternal grandmother did have breast cancer diagnosed in her forties.  Reniya is an only child.     GYNECOLOGIC HISTORY: She is G2 P2.    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  She has a PhD in Interior and spatial designer.  Her husband, Onalee Hua, is Geophysical data processor for the [employer]. They have two children:  Waylan Rocher, who graduated from [employer] in health administration and currently works for [employer],  and [place], who graduated from [employer] and is now Therapist, art at [employer].  The patient is a International aid/development worker.      ADVANCED DIRECTIVES: in place  HEALTH MAINTENANCE: History  Substance Use Topics  . Smoking status: Not on file  . Smokeless tobacco: Not on file  . Alcohol Use: Not on file     Colonoscopy:  PAP:  Bone density:  Lipid panel:  Allergies not on file  Current Outpatient Prescriptions  Medication Sig Dispense Refill  . alendronate (FOSAMAX) 70 MG tablet Take 1 tablet (70 mg total) by mouth every 7 (seven) days. Take with a full glass of water on an empty stomach.  12 tablet  3    OBJECTIVE: Middle-aged white woman who appears well Filed Vitals:   06/13/12 0922  BP: 131/83  Pulse: 101  Temp: 98.7 F (37.1 C)  Resp: 20     Body mass index is 33.65 kg/(m^2).    ECOG FS: 0  Sclerae unicteric Oropharynx clear No cervical or supraclavicular adenopathy Lungs no rales or rhonchi Heart regular rate and rhythm Abd benign MSK no focal  spinal tenderness, no peripheral edema Neuro: nonfocal Breasts: Status post bilateral mastectomies. There is no evidence of disease recurrence   LAB RESULTS: Lab Results  Component Value Date   WBC 6.6 06/12/2011   NEUTROABS 4.1 06/12/2011   HGB 13.4 06/12/2011   HCT 38.9 06/12/2011   MCV 91.4 06/12/2011   PLT 216 06/12/2011      Chemistry      Component Value Date/Time   NA 139 06/12/2011 1434   NA 139 06/12/2011 1434   NA 139 06/12/2011 1434   NA 139 06/12/2011 1434   K 3.9  06/12/2011 1434   K 3.9 06/12/2011 1434   K 3.9 06/12/2011 1434   K 3.9 06/12/2011 1434   CL 101 06/12/2011 1434   CL 101 06/12/2011 1434   CL 101 06/12/2011 1434   CL 101 06/12/2011 1434   CO2 27 06/12/2011 1434   CO2 27 06/12/2011 1434   CO2 27 06/12/2011 1434   CO2 27 06/12/2011 1434   BUN 10 06/12/2011 1434   BUN 10 06/12/2011 1434   BUN 10 06/12/2011 1434   BUN 10 06/12/2011 1434   CREATININE 0.77 06/12/2011 1434   CREATININE 0.77 06/12/2011 1434   CREATININE 0.77 06/12/2011 1434   CREATININE 0.77 06/12/2011 1434      Component Value Date/Time   CALCIUM 9.0 06/12/2011 1434   CALCIUM 9.0 06/12/2011 1434   CALCIUM 9.0 06/12/2011 1434   CALCIUM 9.0 06/12/2011 1434   ALKPHOS 46 06/12/2011 1434   ALKPHOS 46 06/12/2011 1434   ALKPHOS 46 06/12/2011 1434   ALKPHOS 46 06/12/2011 1434   AST 31 06/12/2011 1434   AST 31 06/12/2011 1434   AST 31 06/12/2011 1434   AST 31 06/12/2011 1434   ALT 27 06/12/2011 1434   ALT 27 06/12/2011 1434   ALT 27 06/12/2011 1434   ALT 27 06/12/2011 1434   BILITOT 0.4 06/12/2011 1434   BILITOT 0.4 06/12/2011 1434   BILITOT 0.4 06/12/2011 1434   BILITOT 0.4 06/12/2011 1434       Lab Results  Component Value Date   LABCA2 8 06/12/2011   LABCA2 8 06/12/2011   LABCA2 8 06/12/2011    No components found with this basename: [id number]    No results found for this basename: INR:1;PROTIME:1 in the last 168 hours  Urinalysis    Component Value Date/Time   COLORURINE YELLOW 05/17/2009 2041   APPEARANCEUR CLEAR 05/17/2009 2041   LABSPEC 1.009 05/17/2009 2041   PHURINE 7.5 05/17/2009 2041   GLUCOSEU NEGATIVE 05/17/2009 2041   HGBUR TRACE* 05/17/2009 2041   BILIRUBINUR NEGATIVE 05/17/2009 2041   KETONESUR TRACE* 05/17/2009 2041   PROTEINUR NEGATIVE 05/17/2009 2041   UROBILINOGEN 0.2 05/17/2009 2041   NITRITE NEGATIVE 05/17/2009 2041   LEUKOCYTESUR TRACE* 05/17/2009 2041    STUDIES: No results found.  ASSESSMENT: 51 y.o. [place] woman status  post bilateral mastectomies February 2005 for a right-sided invasive ductal carcinoma, stage IIA (T2 N0), grade 3, triple-positive, treated neoadjuvantly with Taxotere x6, followed by Cytoxan and Adriamycin x4, the final pathology showing only a 1-mm residual focus of invasive ductal carcinoma in the right breast and a negative sentinel lymph node.  She then received adjuvant Herceptin, completed in June 2006.  She took Arimidex between May 2005 and April 2010, at which time she started tamoxifen.   PLAN: The plan is to continue tamoxifen for a total of 5 years, which will be 2015. We will likely discontinue followup at that point. I have  encouraged her to walk or do other kind of exercise 45 minutes 5 times a week, since that has been associated with a decrease in cancer risk. We are going back on gabapentin at bedtime to see if that helps her in night time wakefulness. Otherwise she knows to call for any problems that may develop before the next visit.   MAGRINAT,GUSTAV C    06/13/2012

## 2012-06-13 NOTE — Telephone Encounter (Signed)
gve the pt her oct 2014 appt calendar °

## 2012-07-15 ENCOUNTER — Other Ambulatory Visit: Payer: Self-pay | Admitting: *Deleted

## 2012-07-15 DIAGNOSIS — C50919 Malignant neoplasm of unspecified site of unspecified female breast: Secondary | ICD-10-CM

## 2012-07-15 MED ORDER — VENLAFAXINE HCL ER 75 MG PO CP24: 75.0000 mg | ORAL_CAPSULE | Freq: Every day | ORAL | Status: DC

## 2012-09-05 ENCOUNTER — Other Ambulatory Visit: Payer: Self-pay | Admitting: *Deleted

## 2012-09-05 DIAGNOSIS — C50919 Malignant neoplasm of unspecified site of unspecified female breast: Secondary | ICD-10-CM

## 2012-09-05 MED ORDER — ALENDRONATE SODIUM 70 MG PO TABS: 70.0000 mg | ORAL_TABLET | ORAL | Status: AC

## 2013-06-16 ENCOUNTER — Telehealth: Payer: Self-pay | Admitting: Oncology

## 2013-06-16 ENCOUNTER — Other Ambulatory Visit (HOSPITAL_BASED_OUTPATIENT_CLINIC_OR_DEPARTMENT_OTHER): Payer: BC Managed Care – PPO | Admitting: Lab

## 2013-06-16 ENCOUNTER — Ambulatory Visit (HOSPITAL_BASED_OUTPATIENT_CLINIC_OR_DEPARTMENT_OTHER): Payer: BC Managed Care – PPO | Admitting: Lab

## 2013-06-16 ENCOUNTER — Ambulatory Visit (HOSPITAL_BASED_OUTPATIENT_CLINIC_OR_DEPARTMENT_OTHER): Payer: BC Managed Care – PPO | Admitting: Oncology

## 2013-06-16 VITALS — BP 118/84 | HR 98 | Temp 98.8°F | Resp 18 | Ht 62.0 in | Wt 188.7 lb

## 2013-06-16 DIAGNOSIS — C50919 Malignant neoplasm of unspecified site of unspecified female breast: Secondary | ICD-10-CM

## 2013-06-16 DIAGNOSIS — C50419 Malignant neoplasm of upper-outer quadrant of unspecified female breast: Secondary | ICD-10-CM

## 2013-06-16 LAB — COMPREHENSIVE METABOLIC PANEL (CC13)
ALT: 23 U/L (ref 0–55)
AST: 23 U/L (ref 5–34)
Albumin: 4 g/dL (ref 3.5–5.0)
Anion Gap: 10 mEq/L (ref 3–11)
CO2: 25 mEq/L (ref 22–29)
Calcium: 9.4 mg/dL (ref 8.4–10.4)
Chloride: 105 mEq/L (ref 98–109)
Creatinine: 0.8 mg/dL (ref 0.6–1.1)
Glucose: 83 mg/dl (ref 70–140)
Potassium: 4.1 mEq/L (ref 3.5–5.1)
Total Bilirubin: 0.29 mg/dL (ref 0.20–1.20)
Total Protein: 7.4 g/dL (ref 6.4–8.3)

## 2013-06-16 LAB — CBC WITH DIFFERENTIAL/PLATELET
Basophils Absolute: 0.1 10*3/uL (ref 0.0–0.1)
EOS%: 4.1 % (ref 0.0–7.0)
Eosinophils Absolute: 0.3 10*3/uL (ref 0.0–0.5)
HCT: 40 % (ref 34.8–46.6)
HGB: 13.3 g/dL (ref 11.6–15.9)
LYMPH%: 32.7 % (ref 14.0–49.7)
MCHC: 33.3 g/dL (ref 31.5–36.0)
MONO#: 0.5 10*3/uL (ref 0.1–0.9)
NEUT#: 4.3 10*3/uL (ref 1.5–6.5)
NEUT%: 56 % (ref 38.4–76.8)
Platelets: 213 10*3/uL (ref 145–400)
WBC: 7.6 10*3/uL (ref 3.9–10.3)
lymph#: 2.5 10*3/uL (ref 0.9–3.3)

## 2013-06-16 NOTE — Progress Notes (Signed)
ID: Tammy Huynh   DOB: 01-01-1961  MR#: [id number]  CSN#:[id number]  PCP: No primary provider on file. GYN: Richarda Overlie MD SU:  OTHER MD:   HISTORY OF PRESENT ILLNESS: Tammy Huynh was taking a shower on 9/13 when she noted a lump in her right breast. She brought this to Dr. Dennie Bible attention the next day, and the day after that, he set her up for ultrasound and mammogram of the right breast at [facility].  This showed a new mass (not appreciated last February when she had her most recent mammogram) in the upper outer aspect of the right breast, measuring approximately 3.5 cm by ultrasound, with a second smaller mass measuring approximately 7 mm.  The axilla was clinically negative.  Core biopsy was performed and the results ([id number]) show a high-grade infiltrating ductal carcinoma. Her subsequent history is as detailed below.   INTERVAL HISTORY: Tammy Huynh returns today for followup of her breast cancer. The interval history is unremarkable. 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  REVIEW OF SYSTEMS: She doesn't have problems with hot flashes or vaginal wetness secondary to the tamoxifen. She is exercising regularly at least 3 times a week at the gym. A detailed review of systems today was otherwise entirely noncontributory  PAST MEDICAL HISTORY: No past medical history on file. Significant for fibromyalgia which has been inactive, a heart murmur which was noted 8/16 and which is not apparent now, and has never required antibiotic prophylaxis, and a history of a mild anxiety disorder, status post tonsillectomy and adenoidectomy.  PAST SURGICAL HISTORY: No past surgical history on file.  FAMILY HISTORY (updated OCT 2013) No family history on file. The patient's father has a history of diabetes. The patient's mother is in good health.  The patient's maternal grandmother did have breast cancer diagnosed in  her forties.  Tammy Huynh is an only child.    GYNECOLOGIC HISTORY: She is G2 P2.    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  She has a PhD in Interior and spatial designer.  Her husband, Onalee Hua, is Geophysical data processor for [employer]. They have two children:  Tammy Huynh, who graduated from [employer] in health administration and currently works for [employer],  and Tammy Huynh, who graduated from [employer], then studied Theatre manager at [employer]. He is currently working for her back. No grandchildren. The patient is a International aid/development worker.      ADVANCED DIRECTIVES: in place  HEALTH MAINTENANCE: History  Substance Use Topics  . Smoking status: Not on file  . Smokeless tobacco: Not on file  . Alcohol Use: Not on file     Colonoscopy:  PAP:  Bone density:  Lipid panel:  Allergies not on file  Current Outpatient Prescriptions  Medication Sig Dispense Refill  . alendronate (FOSAMAX) 70 MG tablet Take 1 tablet (70 mg total) by mouth every 7 (seven) days. Take with a full glass of water on an empty stomach.  12 tablet  3  . gabapentin (NEURONTIN) 300 MG capsule Take 1 capsule (300 mg total) by mouth at bedtime.  90 capsule  12  . tamoxifen (NOLVADEX) 20 MG tablet Take 1 tablet (20 mg total) by mouth daily.  90 tablet  12  . venlafaxine (EFFEXOR) 75 MG tablet Take 1 tablet (75 mg total) by mouth daily.  30 tablet  11  . venlafaxine XR (EFFEXOR XR) 75 MG 24 hr capsule Take 1 capsule (75 mg total) by mouth daily.  30 capsule  11   No current facility-administered medications for this visit.    OBJECTIVE: Middle-aged white woman in no acute distress Filed Vitals:   06/16/13 0951  BP: 118/84  Pulse: 98  Temp: 98.8 F (37.1 C)  Resp: 18     Body mass index is 34.51 kg/(m^2).    ECOG FS: 0  Sclerae unicteric, pupils equal round and reactive Oropharynx clear No cervical or supraclavicular  adenopathy Lungs no rales or rhonchi Heart regular rate and rhythm Abd soft, obese, nontender, positive bowel sounds MSK no focal spinal tenderness, no upper extremity lymphedema Neuro: nonfocal, well oriented, positive affect Breasts: Status post bilateral mastectomies. There is no evidence of local recurrence. Both axillae are benign   LAB RESULTS: Lab Results  Component Value Date   WBC 7.0 06/13/2012   NEUTROABS 4.2 06/13/2012   HGB 14.2 06/13/2012   HCT 41.1 06/13/2012   MCV 92.0 06/13/2012   PLT 208 06/13/2012      Chemistry      Component Value Date/Time   NA 140 06/13/2012 1014   NA 139 06/12/2011 1434   K 4.1 06/13/2012 1014   K 3.9 06/12/2011 1434   CL 107 06/13/2012 1014   CL 101 06/12/2011 1434   CO2 24 06/13/2012 1014   CO2 27 06/12/2011 1434   BUN 15.0 06/13/2012 1014   BUN 10 06/12/2011 1434   CREATININE 0.6 06/13/2012 1014   CREATININE 0.77 06/12/2011 1434      Component Value Date/Time   CALCIUM 9.2 06/13/2012 1014   CALCIUM 9.0 06/12/2011 1434   ALKPHOS 59 06/13/2012 1014   ALKPHOS 46 06/12/2011 1434   AST 20 06/13/2012 1014   AST 31 06/12/2011 1434   ALT 20 06/13/2012 1014   ALT 27 06/12/2011 1434   BILITOT 0.40 06/13/2012 1014   BILITOT 0.4 06/12/2011 1434       Lab Results  Component Value Date   LABCA2 8 06/12/2011    No components found with this basename: [id number]    No results found for this basename: INR,  in the last 168 hours  Urinalysis    Component Value Date/Time   COLORURINE YELLOW 05/17/2009 2041   APPEARANCEUR CLEAR 05/17/2009 2041   LABSPEC 1.009 05/17/2009 2041   PHURINE 7.5 05/17/2009 2041   GLUCOSEU NEGATIVE 05/17/2009 2041   HGBUR TRACE* 05/17/2009 2041   BILIRUBINUR NEGATIVE 05/17/2009 2041   KETONESUR TRACE* 05/17/2009 2041   PROTEINUR NEGATIVE 05/17/2009 2041   UROBILINOGEN 0.2 05/17/2009 2041   NITRITE NEGATIVE 05/17/2009 2041   LEUKOCYTESUR TRACE* 05/17/2009 2041    STUDIES: No results found.  ASSESSMENT:  52 y.o. [place] woman status post bilateral mastectomies February 2005 for a right-sided invasive ductal carcinoma, stage IIA (T2 N0), grade 3, triple-positive, treated neoadjuvantly with Taxotere x6, followed by Cytoxan and Adriamycin x4, the final pathology showing only a 1-mm residual focus of invasive ductal carcinoma in the right breast and a negative sentinel lymph node.  She then received adjuvant Herceptin, completed in June 2006.  She took Arimidex between May 2005 and April 2010, at which time she started tamoxifen.   PLAN: Araiya will initiate her 10 year mark in February of 2015 and will stop the tamoxifen there. Accordingly we went ahead and she "graduated" today. She does not have a primary care physician and we will see  if we can establish her with [facility]. We will obtain a final set of labs today and she can axis fat of course through "my chart".  Otherwise all she needs as far as breast cancer followup is concerned is a yearly physician and chest wall exam. She knows that we'll be glad to see her at any point in the future if the need arises, but as of now no further appointments are being made for her here.   MAGRINAT,GUSTAV C    06/16/2013

## 2013-06-16 NOTE — Telephone Encounter (Signed)
, °

## 2013-07-31 ENCOUNTER — Other Ambulatory Visit: Payer: Self-pay

## 2013-07-31 DIAGNOSIS — C50911 Malignant neoplasm of unspecified site of right female breast: Secondary | ICD-10-CM

## 2013-07-31 MED ORDER — VENLAFAXINE HCL ER 75 MG PO CP24: 75.0000 mg | ORAL_CAPSULE | Freq: Every day | ORAL | Status: AC

## 2013-07-31 MED ORDER — TAMOXIFEN CITRATE 20 MG PO TABS: 20.0000 mg | ORAL_TABLET | Freq: Every day | ORAL | Status: DC

## 2013-07-31 MED ORDER — GABAPENTIN 300 MG PO CAPS: 300.0000 mg | ORAL_CAPSULE | Freq: Every day | ORAL | Status: AC

## 2013-07-31 NOTE — Telephone Encounter (Signed)
Ms. Mostafa aware that the effexor and gabapentin will be filled for 3 months and then PCP needs to [place] from [place].  She will call and set up an appointment with Dr. Almon Register to est. PCP as recommended by Dr. Darnelle Catalan.

## 2013-10-29 ENCOUNTER — Telehealth: Payer: Self-pay | Admitting: Oncology

## 2013-10-29 NOTE — Telephone Encounter (Signed)
Fax medical records to [facility].

## 2017-07-16 NOTE — H&P (Signed)
Tammy Huynh  DICTATION # [id number] CSN# [id number]   Margarette Asal, MD 07/16/2017 2:19 PM

## 2017-07-17 NOTE — H&P (Signed)
NAMEROZETTA, Tammy Huynh                ACCOUNT NO.:  [id number]  MEDICAL RECORD NO.:  [id number]  LOCATION:                                 FACILITY:  PHYSICIAN:  Ralene Bathe. Matthew Saras, M.D.DATE OF BIRTH:  10/14/1960  DATE OF ADMISSION: DATE OF DISCHARGE:                             HISTORY & PHYSICAL   CHIEF COMPLAINT:  Stress urinary incontinence.  HISTORY OF PRESENT ILLNESS:  A 56 year old, G2, P2, postmenopausal breast cancer survivor.  She had bilateral mastectomy in 2005.  Followup has been in the ED since that time.  Dr. Jana Hakim was released her from ongoing followup.  She is on venlafaxine 75 mg a day, gabapentin and tamoxifen.  Prior to her breast cancer treatment, she had been evaluated in 2010 with urodynamics here in the office that showed of 0 PVR, normal bladder filling with a normal LPP, did leak with a full bladder.  We had actually discussed single incision sling at that time.  She did leak with full bladder and had normal LPP noted also.  The procedure reviewed with her again after we had discussed at that time, symptoms are similar.  The procedure including specific risks related to bleeding, infection, adjacent organ injury, mesh erosion and how it is treated along with her expectations for continence and longevity at the procedure based on current information was discussed with her.  All of her questions were answered.  CURRENT MEDICATIONS:  Venlafaxine, gabapentin, calcium, vitamin D.  SURGICAL HISTORY:  Tonsillectomy, two vaginal deliveries, double mastectomy in 2005 and had a bunion surgery in 2012.  She has a personal history of osteoporosis, diverticulosis, and breast cancer.  FAMILY HISTORY:  Positive for arthritis, hypertension, and lung and uterine cancer.  SOCIAL HISTORY:  She is married.  Denies drug or tobacco use.  She does drink socially.  Dr. Gwenyth Allegra is her medical doctor.  PHYSICAL EXAMINATION:  VITAL SIGNS:  Temperature 98.2, blood  pressure 120/72. HEENT:  Unremarkable. NECK:  Supple without masses. LUNGS:  Clear. CARDIOVASCULAR:  Regular rate and rhythm without murmurs, rubs, gallops noted.  She has had a bilateral mastectomy. ABDOMEN:  Soft, flat, and nontender.  Vulva, vagina, and cervix unremarkable.  Uterus mid position, nontender.  Adnexa negative. EXTREMITIES:  Unremarkable. NEUROLOGIC:  Unremarkable.  IMPRESSION:  Stress urinary incontinence.  PLAN:  Mid urethral sling.  Procedure and risks reviewed as above.     Rolland Steinert M. Matthew Saras, M.D.   ______________________________ Ralene Bathe. Matthew Saras, M.D.    [facility]/MEDQ  D:  07/16/2017  T:  07/17/2017  Job:  [id number]

## 2017-08-02 ENCOUNTER — Other Ambulatory Visit: Payer: Self-pay

## 2017-08-02 ENCOUNTER — Encounter (HOSPITAL_BASED_OUTPATIENT_CLINIC_OR_DEPARTMENT_OTHER): Payer: Self-pay | Admitting: *Deleted

## 2017-08-02 NOTE — Progress Notes (Signed)
Npo after midnight arrive 530am [facility] , no meds to take. Spouse david driver

## 2017-08-03 ENCOUNTER — Encounter (HOSPITAL_COMMUNITY)
Admission: RE | Admit: 2017-08-03 | Discharge: 2017-08-03 | Disposition: A | Discharge status: Home / Self Care | Payer: BC Managed Care – PPO | Source: Ambulatory Visit | Attending: Obstetrics and Gynecology | Admitting: Obstetrics and Gynecology

## 2017-08-03 DIAGNOSIS — N393 Stress incontinence (female) (male): Secondary | ICD-10-CM | POA: Diagnosis present

## 2017-08-03 DIAGNOSIS — Z79899 Other long term (current) drug therapy: Secondary | ICD-10-CM | POA: Diagnosis not present

## 2017-08-03 DIAGNOSIS — Z9013 Acquired absence of bilateral breasts and nipples: Secondary | ICD-10-CM | POA: Diagnosis not present

## 2017-08-03 DIAGNOSIS — Z853 Personal history of malignant neoplasm of breast: Secondary | ICD-10-CM | POA: Diagnosis not present

## 2017-08-03 LAB — CBC
HEMATOCRIT: 40.8 % (ref 36.0–46.0)
Hemoglobin: 13.7 g/dL (ref 12.0–15.0)
MCH: 30 pg (ref 26.0–34.0)
MCHC: 33.6 g/dL (ref 30.0–36.0)
MCV: 89.5 fL (ref 78.0–100.0)
PLATELETS: 232 10*3/uL (ref 150–400)
RBC: 4.56 MIL/uL (ref 3.87–5.11)
RDW: 13.2 % (ref 11.5–15.5)
WBC: 6.5 10*3/uL (ref 4.0–10.5)

## 2017-08-03 LAB — ABO/RH: ABO/RH(D): O POS

## 2017-08-06 ENCOUNTER — Ambulatory Visit (HOSPITAL_BASED_OUTPATIENT_CLINIC_OR_DEPARTMENT_OTHER): Payer: BC Managed Care – PPO | Admitting: Anesthesiology

## 2017-08-06 ENCOUNTER — Other Ambulatory Visit: Payer: Self-pay

## 2017-08-06 ENCOUNTER — Encounter (HOSPITAL_BASED_OUTPATIENT_CLINIC_OR_DEPARTMENT_OTHER): Payer: Self-pay | Admitting: *Deleted

## 2017-08-06 ENCOUNTER — Encounter (HOSPITAL_BASED_OUTPATIENT_CLINIC_OR_DEPARTMENT_OTHER)
Admission: RE | Disposition: A | Discharge status: Home / Self Care | Payer: Self-pay | Source: Ambulatory Visit | Attending: Obstetrics and Gynecology

## 2017-08-06 ENCOUNTER — Ambulatory Visit (HOSPITAL_BASED_OUTPATIENT_CLINIC_OR_DEPARTMENT_OTHER)
Admission: RE | Admit: 2017-08-06 | Discharge: 2017-08-06 | Disposition: A | Discharge status: Home / Self Care | Payer: BC Managed Care – PPO | Source: Ambulatory Visit | Attending: Obstetrics and Gynecology | Admitting: Obstetrics and Gynecology

## 2017-08-06 DIAGNOSIS — Z853 Personal history of malignant neoplasm of breast: Secondary | ICD-10-CM | POA: Insufficient documentation

## 2017-08-06 DIAGNOSIS — N393 Stress incontinence (female) (male): Secondary | ICD-10-CM | POA: Diagnosis not present

## 2017-08-06 DIAGNOSIS — Z9013 Acquired absence of bilateral breasts and nipples: Secondary | ICD-10-CM | POA: Insufficient documentation

## 2017-08-06 DIAGNOSIS — Z79899 Other long term (current) drug therapy: Secondary | ICD-10-CM | POA: Insufficient documentation

## 2017-08-06 HISTORY — DX: Cardiac murmur, unspecified: R01.1

## 2017-08-06 HISTORY — PX: BLADDER SUSPENSION: SHX72

## 2017-08-06 HISTORY — DX: Malignant (primary) neoplasm, unspecified: C80.1

## 2017-08-06 LAB — TYPE AND SCREEN
ABO/RH(D): O POS
ANTIBODY SCREEN: NEGATIVE

## 2017-08-06 SURGERY — TRANSVAGINAL TAPE (TVT) PROCEDURE
Anesthesia: General

## 2017-08-06 MED ORDER — LIDOCAINE 2% (20 MG/ML) 5 ML SYRINGE
INTRAMUSCULAR | Status: AC
  Filled 2017-08-06: qty 5

## 2017-08-06 MED ORDER — DEXAMETHASONE SODIUM PHOSPHATE 10 MG/ML IJ SOLN
INTRAMUSCULAR | Status: AC
  Filled 2017-08-06: qty 1

## 2017-08-06 MED ORDER — IBUPROFEN 200 MG PO TABS: 600.0000 mg | ORAL_TABLET | Freq: Four times a day (QID) | ORAL | 1 refills | Status: AC | PRN

## 2017-08-06 MED ORDER — ONDANSETRON HCL 4 MG/2ML IJ SOLN
INTRAMUSCULAR | Status: AC
  Filled 2017-08-06: qty 2

## 2017-08-06 MED ORDER — PROPOFOL 10 MG/ML IV BOLUS
INTRAVENOUS | Status: DC | PRN
  Administered 2017-08-06: 200 mg via INTRAVENOUS

## 2017-08-06 MED ORDER — SODIUM CHLORIDE 0.9 % IR SOLN
Status: DC | PRN
  Administered 2017-08-06: 500 mL
  Administered 2017-08-06: 1000 mL

## 2017-08-06 MED ORDER — CEFOTETAN DISODIUM 2 G IJ SOLR
2.0000 g | INTRAMUSCULAR | Status: AC
  Administered 2017-08-06: 2 g via INTRAVENOUS
  Filled 2017-08-06: qty 2

## 2017-08-06 MED ORDER — LACTATED RINGERS IV SOLN
INTRAVENOUS | Status: DC
  Administered 2017-08-06: 1000 mL via INTRAVENOUS
  Administered 2017-08-06: 08:00:00 via INTRAVENOUS
  Filled 2017-08-06: qty 1000

## 2017-08-06 MED ORDER — LIDOCAINE 2% (20 MG/ML) 5 ML SYRINGE
INTRAMUSCULAR | Status: DC | PRN
  Administered 2017-08-06: 100 mg via INTRAVENOUS

## 2017-08-06 MED ORDER — FENTANYL CITRATE (PF) 100 MCG/2ML IJ SOLN
INTRAMUSCULAR | Status: AC
  Filled 2017-08-06: qty 2

## 2017-08-06 MED ORDER — ONDANSETRON HCL 4 MG/2ML IJ SOLN
INTRAMUSCULAR | Status: DC | PRN
  Administered 2017-08-06: 4 mg via INTRAVENOUS

## 2017-08-06 MED ORDER — DEXAMETHASONE SODIUM PHOSPHATE 4 MG/ML IJ SOLN
INTRAMUSCULAR | Status: DC | PRN
  Administered 2017-08-06: 10 mg via INTRAVENOUS

## 2017-08-06 MED ORDER — MIDAZOLAM HCL 2 MG/2ML IJ SOLN
INTRAMUSCULAR | Status: AC
  Filled 2017-08-06: qty 2

## 2017-08-06 MED ORDER — OXYCODONE-ACETAMINOPHEN 5-325 MG PO TABS
1.0000 | ORAL_TABLET | Freq: Four times a day (QID) | ORAL | 0 refills | Status: AC | PRN
Start: 2017-08-06 — End: ?

## 2017-08-06 MED ORDER — PROPOFOL 10 MG/ML IV BOLUS
INTRAVENOUS | Status: AC
  Filled 2017-08-06: qty 40

## 2017-08-06 MED ORDER — METOCLOPRAMIDE HCL 5 MG/ML IJ SOLN
INTRAMUSCULAR | Status: AC
  Filled 2017-08-06: qty 2

## 2017-08-06 MED ORDER — ARTIFICIAL TEARS OPHTHALMIC OINT
TOPICAL_OINTMENT | OPHTHALMIC | Status: AC
  Filled 2017-08-06: qty 3.5

## 2017-08-06 MED ORDER — CEFOTETAN DISODIUM-DEXTROSE 2-2.08 GM-%(50ML) IV SOLR
INTRAVENOUS | Status: AC
  Filled 2017-08-06: qty 50

## 2017-08-06 MED ORDER — MIDAZOLAM HCL 5 MG/5ML IJ SOLN
INTRAMUSCULAR | Status: DC | PRN
  Administered 2017-08-06: 2 mg via INTRAVENOUS

## 2017-08-06 MED ORDER — FENTANYL CITRATE (PF) 100 MCG/2ML IJ SOLN
INTRAMUSCULAR | Status: DC | PRN
  Administered 2017-08-06 (×2): 50 ug via INTRAVENOUS

## 2017-08-06 MED ORDER — KETOROLAC TROMETHAMINE 30 MG/ML IJ SOLN
INTRAMUSCULAR | Status: DC | PRN
  Administered 2017-08-06: 30 mg via INTRAVENOUS

## 2017-08-06 MED ORDER — KETOROLAC TROMETHAMINE 30 MG/ML IJ SOLN
INTRAMUSCULAR | Status: AC
  Filled 2017-08-06: qty 1

## 2017-08-06 MED ORDER — LIDOCAINE-EPINEPHRINE 1 %-1:100000 IJ SOLN
INTRAMUSCULAR | Status: DC | PRN
  Administered 2017-08-06: 7 mL

## 2017-08-06 MED ORDER — FENTANYL CITRATE (PF) 100 MCG/2ML IJ SOLN
25.0000 ug | INTRAMUSCULAR | Status: DC | PRN
  Filled 2017-08-06: qty 1

## 2017-08-06 SURGICAL SUPPLY — 27 items
BLADE SURG 15 STRL LF DISP TIS (BLADE) ×1 IMPLANT
BLADE SURG 15 STRL SS (BLADE) ×2
CANISTER SUCT 3000ML PPV (MISCELLANEOUS) ×3 IMPLANT
CATH ROBINSON RED A/P 16FR (CATHETERS) ×3 IMPLANT
COVER MAYO STAND STRL (DRAPES) ×3 IMPLANT
DECANTER SPIKE VIAL GLASS SM (MISCELLANEOUS) ×3 IMPLANT
GLOVE BIO SURGEON STRL SZ7 (GLOVE) ×3 IMPLANT
GOWN STRL REUS W/ TWL LRG LVL3 (GOWN DISPOSABLE) ×4 IMPLANT
GOWN STRL REUS W/TWL LRG LVL3 (GOWN DISPOSABLE) ×8
KIT RM TURNOVER CYSTO AR (KITS) ×3 IMPLANT
NEEDLE HYPO 25X1 1.5 SAFETY (NEEDLE) ×3 IMPLANT
NS IRRIG 500ML POUR BTL (IV SOLUTION) ×3 IMPLANT
PACK VAGINAL WOMENS (CUSTOM PROCEDURE TRAY) ×3 IMPLANT
PACKING VAGINAL (PACKING) IMPLANT
PAD OB MATERNITY 4.3X12.25 (PERSONAL CARE ITEMS) ×3 IMPLANT
PLUG CATH AND CAP STER (CATHETERS) IMPLANT
SET IRRIG Y TYPE TUR BLADDER L (SET/KITS/TRAYS/PACK) ×3 IMPLANT
SLING SOLYX SYSTEM SIS EA (Sling) ×3 IMPLANT
SPONGE LAP 4X18 X RAY DECT (DISPOSABLE) IMPLANT
SUT VIC AB 2-0 CT1 27 (SUTURE)
SUT VIC AB 2-0 CT1 TAPERPNT 27 (SUTURE) IMPLANT
SUT VIC AB 2-0 SH 27 (SUTURE) ×4
SUT VIC AB 2-0 SH 27XBRD (SUTURE) ×2 IMPLANT
SUT VIC AB 2-0 UR6 27 (SUTURE) IMPLANT
TOWEL OR 17X24 6PK STRL BLUE (TOWEL DISPOSABLE) ×6 IMPLANT
TRAY FOLEY CATH SILVER 14FR (SET/KITS/TRAYS/PACK) ×3 IMPLANT
YANKAUER SUCT BULB TIP NO VENT (SUCTIONS) ×3 IMPLANT

## 2017-08-06 NOTE — Op Note (Signed)
Preoperative diagnosis: Stress urinary incontinence  Postoperative diagnosis: Same  Procedure: Solyx single incision mid urethral sling, cystoscopy  Surgeon: Matthew Saras  Anesthesia: General  EBL: 10 cc  Procedure and findings:  Patient was taken to the operating room after an adequate level of general anesthesia was obtained with the legs in stirrups the perineum and vagina were prepped and draped in the bladder was drained.  Appropriate timeout was taken at that point.  Weighted speculum was then positioned  , The mid urethral area was identified between 2 Allis clamps vertically 1% Xylocaine with epi was injected into the planned operative site.  2 cm midline incision was then made with Metzenbaum scissors and finger dissection these areas were opened so that the surgeon could palpate the posterior portion of the inferior pubic ramus on each side.  The Solix sling was marked to the midway point, starting on the right was placed posterior to the inferior pubic ramus at a 45 degree angle to the halfway point and released.  The sling was then re-articulated and similarly on the left was placed posterior to the inferior pubic ramus, appropriate tension was obtained and the anchor was released.  Cystoscopy was carried out both ureteral jets were noted there was no evidence of any bladder or urethral trauma noted.  The bladder was drained.  The vaginal mucosa incision was closed with interrupted 2-0 Vicryl sutures in this area was hemostatic.  Foley catheter was positioned draining clear urine, she tolerated this well went to recovery room in good condition.  Dictated with [employer] Margarette Asal MD

## 2017-08-06 NOTE — Progress Notes (Signed)
The patient was re-examined with no change in status 

## 2017-08-06 NOTE — Anesthesia Preprocedure Evaluation (Signed)
Anesthesia Evaluation  Patient identified by MRN, date of birth, ID band Patient awake    Reviewed: Allergy & Precautions, NPO status , Patient's Chart, lab work & pertinent test results  Airway Mallampati: II  TM Distance: >3 FB     Dental   Pulmonary neg pulmonary ROS,    breath sounds clear to auscultation       Cardiovascular + Valvular Problems/Murmurs  Rhythm:Regular Rate:Normal  History of heart murmur as teenager. W/U neg as per patient. CG   Neuro/Psych    GI/Hepatic negative GI ROS, Neg liver ROS,   Endo/Other  negative endocrine ROS  Renal/GU negative Renal ROS     Musculoskeletal   Abdominal   Peds  Hematology   Anesthesia Other Findings   Reproductive/Obstetrics                             Anesthesia Physical Anesthesia Plan  ASA: II  Anesthesia Plan: General   Post-op Pain Management:    Induction: Intravenous  PONV Risk Score and Plan: Ondansetron, Dexamethasone and Midazolam  Airway Management Planned: LMA and Oral ETT  Additional Equipment:   Intra-op Plan:   Post-operative Plan: Extubation in OR  Informed Consent: I have reviewed the patients History and Physical, chart, labs and discussed the procedure including the risks, benefits and alternatives for the proposed anesthesia with the patient or authorized representative who has indicated his/her understanding and acceptance.   Dental advisory given  Plan Discussed with: CRNA and Anesthesiologist  Anesthesia Plan Comments:         Anesthesia Quick Evaluation

## 2017-08-06 NOTE — Discharge Instructions (Signed)

## 2017-08-06 NOTE — Discharge Summary (Signed)
Physician Discharge Summary  Patient ID: Tammy Huynh MRN: [id number] DOB/AGE: Feb 04, 1961 56 y.o.  Admit date: 08/06/2017 Discharge date: 08/06/2017  Admission Diagnoses:SUI  Discharge Diagnoses: SAME Active Problems:   * No active hospital problems. *   Discharged Condition: good  Hospital Course: OPT midurethral sling w/o compl, nl cysto  Consults: None  Significant Diagnostic Studies: labs:  CBC    Component Value Date/Time   WBC 6.5 08/03/2017 1003   RBC 4.56 08/03/2017 1003   HGB 13.7 08/03/2017 1003   HGB 13.3 06/16/2013 1051   HCT 40.8 08/03/2017 1003   HCT 40.0 06/16/2013 1051   PLT 232 08/03/2017 1003   PLT 213 06/16/2013 1051   MCV 89.5 08/03/2017 1003   MCV 91.3 06/16/2013 1051   MCH 30.0 08/03/2017 1003   MCHC 33.6 08/03/2017 1003   RDW 13.2 08/03/2017 1003   RDW 13.0 06/16/2013 1051   LYMPHSABS 2.5 06/16/2013 1051   MONOABS 0.5 06/16/2013 1051   EOSABS 0.3 06/16/2013 1051   BASOSABS 0.1 06/16/2013 1051     Treatments: surgery: Solyx MUS  Discharge Exam: Blood pressure 128/81, pulse 92, temperature 99.1 F (37.3 C), temperature source Oral, resp. rate 16, height 5\' 2"  (1.575 m), weight 187 lb 14.4 oz (85.2 kg), SpO2 99 %. abd benign, suture line intact  Disposition:    Allergies as of 08/06/2017   No Known Allergies     Medication List    TAKE these medications   alendronate 70 MG tablet Commonly known as:  FOSAMAX Take 1 tablet (70 mg total) by mouth every 7 (seven) days. Take with a full glass of water on an empty stomach. What changed:  additional instructions   gabapentin 300 MG capsule Commonly known as:  NEURONTIN Take 1 capsule (300 mg total) by mouth at bedtime.   ibuprofen 200 MG tablet Commonly known as:  ADVIL Take 3 tablets (600 mg total) by mouth every 6 (six) hours as needed for moderate pain.   oxyCODONE-acetaminophen 5-325 MG tablet Commonly known as:  ROXICET Take 1-2 tablets by mouth every 6 (six) hours as  needed for severe pain.   venlafaxine 75 MG tablet Commonly known as:  EFFEXOR Take 1 tablet (75 mg total) by mouth daily. What changed:  Another medication with the same name was changed. Make sure you understand how and when to take each.   venlafaxine XR 75 MG 24 hr capsule Commonly known as:  EFFEXOR XR Take 1 capsule (75 mg total) by mouth daily. What changed:  when to take this      Follow-up Information    Molli Posey, MD. Schedule an appointment as soon as possible for a visit in 2 week(s).   Specialty:  Obstetrics and Gynecology Contact information: [place] [address] [phone]           Signed: Margarette Asal 08/06/2017, 7:58 AM

## 2017-08-06 NOTE — Transfer of Care (Signed)
  Last Vitals:  Vitals:   08/06/17 0539 08/06/17 0805  BP: 128/81 131/75  Pulse: 92 (!) 112  Resp: 16 17  Temp: 37.3 C 36.7 C  SpO2: 99% 97%    Last Pain:  Vitals:   08/06/17 0539  TempSrc: Oral      Patients Stated Pain Goal: 6 (08/06/17 6837)  Immediate Anesthesia Transfer of Care Note  Patient: Tammy Huynh  Procedure(s) Performed: Procedure(s) (LRB): Solyx Single Incision Sling with cystoscopy (N/A)  Patient Location: PACU  Anesthesia Type: General  Level of Consciousness: awake, alert  and oriented  Airway & Oxygen Therapy: Patient Spontanous Breathing and Patient connected to nasal caanula oxygen  Post-op Assessment: Report given to PACU RN and Post -op Vital signs reviewed and stable  Post vital signs: Reviewed and stable  Complications: No apparent anesthesia complications

## 2017-08-06 NOTE — Anesthesia Procedure Notes (Signed)
Procedure Name: LMA Insertion Date/Time: 08/06/2017 7:26 AM Performed by: Belinda Block, MD Pre-anesthesia Checklist: Patient identified, Emergency Drugs available, Suction available and Patient being monitored Patient Re-evaluated:Patient Re-evaluated prior to induction Oxygen Delivery Method: Circle system utilized Preoxygenation: Pre-oxygenation with 100% oxygen Induction Type: IV induction Ventilation: Mask ventilation without difficulty LMA: LMA inserted LMA Size: 4.0 Number of attempts: 1 Airway Equipment and Method: Bite block Placement Confirmation: positive ETCO2 Tube secured with: Tape Dental Injury: Teeth and Oropharynx as per pre-operative assessment

## 2017-08-06 NOTE — Anesthesia Postprocedure Evaluation (Signed)
Anesthesia Post Note  Patient: Tammy Huynh  Procedure(s) Performed: Solyx Single Incision Sling with cystoscopy (N/A )     Patient location during evaluation: PACU Anesthesia Type: General Level of consciousness: awake Pain management: pain level controlled Respiratory status: spontaneous breathing Cardiovascular status: stable Anesthetic complications: no    Last Vitals:  Vitals:   08/06/17 0805 08/06/17 0815  BP: 131/75 138/88  Pulse: (!) 112 (!) 104  Resp: 17 12  Temp: 36.7 C   SpO2: 97% 100%    Last Pain:  Vitals:   08/06/17 0805  TempSrc:   PainSc: 0-No pain                 Jaydah Stahle

## 2017-08-07 ENCOUNTER — Encounter (HOSPITAL_BASED_OUTPATIENT_CLINIC_OR_DEPARTMENT_OTHER): Payer: Self-pay | Admitting: Obstetrics and Gynecology
